# Patient Record
Sex: Male | Born: 1975 | Hispanic: No | Marital: Single | Smoking: Never smoker
Health system: Southern US, Community
[De-identification: ages and names within clinical notes are randomized; demographics above are authoritative.]

---

## 2002-05-25 ENCOUNTER — Emergency Department (HOSPITAL_COMMUNITY): Admission: EM | Admit: 2002-05-25 | Discharge: 2002-05-25 | Payer: Self-pay | Admitting: Emergency Medicine

## 2002-05-25 ENCOUNTER — Encounter: Payer: Self-pay | Admitting: Emergency Medicine

## 2014-12-26 ENCOUNTER — Emergency Department (INDEPENDENT_AMBULATORY_CARE_PROVIDER_SITE_OTHER): Payer: Medicaid - Out of State

## 2014-12-26 ENCOUNTER — Encounter (HOSPITAL_COMMUNITY): Payer: Self-pay | Admitting: Emergency Medicine

## 2014-12-26 ENCOUNTER — Emergency Department (HOSPITAL_COMMUNITY): Payer: Medicaid - Out of State

## 2014-12-26 ENCOUNTER — Emergency Department (INDEPENDENT_AMBULATORY_CARE_PROVIDER_SITE_OTHER)
Admission: EM | Admit: 2014-12-26 | Discharge: 2014-12-26 | Disposition: A | Discharge status: Home / Self Care | Payer: Medicaid - Out of State | Source: Home / Self Care | Attending: Family Medicine | Admitting: Family Medicine

## 2014-12-26 DIAGNOSIS — S60222A Contusion of left hand, initial encounter: Secondary | ICD-10-CM

## 2014-12-26 DIAGNOSIS — S6000XA Contusion of unspecified finger without damage to nail, initial encounter: Secondary | ICD-10-CM | POA: Diagnosis not present

## 2014-12-26 DIAGNOSIS — S60221A Contusion of right hand, initial encounter: Secondary | ICD-10-CM

## 2014-12-26 DIAGNOSIS — S62308A Unspecified fracture of other metacarpal bone, initial encounter for closed fracture: Secondary | ICD-10-CM | POA: Diagnosis not present
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
## 2014-12-26 NOTE — Discharge Instructions (Signed)
Cast or Splint Care °Casts and splints support injured limbs and keep bones from moving while they heal. It is important to care for your cast or splint at home.   °HOME CARE INSTRUCTIONS °· Keep the cast or splint uncovered during the drying period. It can take 24 to 48 hours to dry if it is made of plaster. A fiberglass cast will dry in less than 1 hour. °· Do not rest the cast on anything harder than a pillow for the first 24 hours. °· Do not put weight on your injured limb or apply pressure to the cast until your health care provider gives you permission. °· Keep the cast or splint dry. Wet casts or splints can lose their shape and may not support the limb as well. A wet cast that has lost its shape can also create harmful pressure on your skin when it dries. Also, wet skin can become infected. °· Cover the cast or splint with a plastic bag when bathing or when out in the rain or snow. If the cast is on the trunk of the body, take sponge baths until the cast is removed. °· If your cast does become wet, dry it with a towel or a blow dryer on the cool setting only. °· Keep your cast or splint clean. Soiled casts may be wiped with a moistened cloth. °· Do not place any hard or soft foreign objects under your cast or splint, such as cotton, toilet paper, lotion, or powder. °· Do not try to scratch the skin under the cast with any object. The object could get stuck inside the cast. Also, scratching could lead to an infection. If itching is a problem, use a blow dryer on a cool setting to relieve discomfort. °· Do not trim or cut your cast or remove padding from inside of it. °· Exercise all joints next to the injury that are not immobilized by the cast or splint. For example, if you have a long leg cast, exercise the hip joint and toes. If you have an arm cast or splint, exercise the shoulder, elbow, thumb, and fingers. °· Elevate your injured arm or leg on 1 or 2 pillows for the first 1 to 3 days to decrease  swelling and pain. It is best if you can comfortably elevate your cast so it is higher than your heart. °SEEK MEDICAL CARE IF:  °· Your cast or splint cracks. °· Your cast or splint is too tight or too loose. °· You have unbearable itching inside the cast. °· Your cast becomes wet or develops a soft spot or area. °· You have a bad smell coming from inside your cast. °· You get an object stuck under your cast. °· Your skin around the cast becomes red or raw. °· You have new pain or worsening pain after the cast has been applied. °SEEK IMMEDIATE MEDICAL CARE IF:  °· You have fluid leaking through the cast. °· You are unable to move your fingers or toes. °· You have discolored (blue or white), cool, painful, or very swollen fingers or toes beyond the cast. °· You have tingling or numbness around the injured area. °· You have severe pain or pressure under the cast. °· You have any difficulty with your breathing or have shortness of breath. °· You have chest pain. °Document Released: 05/20/2000 Document Revised: 03/13/2013 Document Reviewed: 11/29/2012 °Exit[employer]® Patient Information ©2015 [employer]. This information is not intended to replace advice given to you by your health care   provider. Make sure you discuss any questions you have with your health care provider.  Hand Fracture, Metacarpals Fractures of metacarpals are breaks in the bones of the hand. They extend from the knuckles to the wrist. These bones can undergo many types of fractures. There are different ways of treating these fractures, all of which may be correct. TREATMENT  Hand fractures can be treated with:   Non-reduction - The fracture is casted without changing the positions of the fracture (bone pieces) involved. This fracture is usually left in a cast for 4 to 6 weeks or as your caregiver thinks necessary.  Closed reduction - The bones are moved back into position without surgery and then casted.  ORIF (open reduction and internal  fixation) - The fracture site is opened and the bone pieces are fixed into place with some type of hardware, such as screws, etc. They are then casted. Your caregiver will discuss the type of fracture you have and the treatment that should be best for that problem. If surgery is chosen, let your caregivers know about the following.  LET YOUR CAREGIVERS KNOW ABOUT:  Allergies.  Medications you are taking, including herbs, eye drops, over the counter medications, and creams.  Use of steroids (by mouth or creams).  Previous problems with anesthetics or novocaine.  Possibility of pregnancy.  History of blood clots (thrombophlebitis).  History of bleeding or blood problems.  Previous surgeries.  Other health problems. AFTER THE PROCEDURE After surgery, you will be taken to the recovery area where a nurse will watch and check your progress. Once you are awake, stable, and taking fluids well, barring other problems, you'll be allowed to go home. Once home, an ice pack applied to your operative site may help with pain and keep the swelling down. HOME CARE INSTRUCTIONS   Follow your caregiver's instructions as to activities, exercises, physical therapy, and driving a car.  Daily exercise is helpful for keeping range of motion and strength. Exercise as instructed.  To lessen swelling, keep the injured hand elevated above the level of your heart as much as possible.  Apply ice to the injury for 15-20 minutes each hour while awake for the first 2 days. Put the ice in a plastic bag and place a thin towel between the bag of ice and your cast.  Move the fingers of your casted hand several times a day.  If a plaster or fiberglass cast was applied:  Do not try to scratch the skin under the cast using a sharp or pointed object.  Check the skin around the cast every day. You may put lotion on red or sore areas.  Keep your cast dry. Your cast can be protected during bathing with a plastic bag.  Do not put your cast into the water.  If a plaster splint was applied:  Wear your splint for as long as directed by your caregiver or until seen again.  Do not get your splint wet. Protect it during bathing with a plastic bag.  You may loosen the elastic bandage around the splint if your fingers start to get numb, tingle, get cold or turn blue.  Do not put pressure on your cast or splint; this may cause it to break. Especially, do not lean plaster casts on hard surfaces for 24 hours after application.  Take medications as directed by your caregiver.  Only take over-the-counter or prescription medicines for pain, discomfort, or fever as directed by your caregiver.  Follow-up as provided by  your caregiver. This is very important in order to avoid permanent injury or disability and chronic pain. SEEK MEDICAL CARE IF:   Increased bleeding (more than a small spot) from beneath your cast or splint if there is beneath the cast as with an open reduction.  Redness, swelling, or increasing pain in the wound or from beneath your cast or splint.  Pus coming from wound or from beneath your cast or splint.  An unexplained oral temperature above 102 F (38.9 C) develops, or as your caregiver suggests.  A foul smell coming from the wound or dressing or from beneath your cast or splint.  You have a problem moving any of your fingers. SEEK IMMEDIATE MEDICAL CARE IF:   You develop a rash  You have difficulty breathing  You have any allergy problems If you do not have a window in your cast for observing the wound, a discharge or minor bleeding may show up as a stain on the outside of your cast. Report these findings to your caregiver. MAKE SURE YOU:   Understand these instructions.  Will watch your condition.  Will get help right away if you are not doing well or get worse. Document Released: 05/23/2005 Document Revised: 08/15/2011 Document Reviewed: 01/10/2008 [facility] Patient  Information 2015 [place], [place]. This information is not intended to replace advice given to you by your health care provider. Make sure you discuss any questions you have with your health care provider.
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
## 2017-02-17 IMAGING — DX DG HAND COMPLETE 3+V*R*
3 series · 3 of 3 positions shown · non-contrast
Comparison: None.

CLINICAL DATA: Right hand pain, trauma

EXAM:
RIGHT HAND - COMPLETE 3+ VIEW

[hand pa]
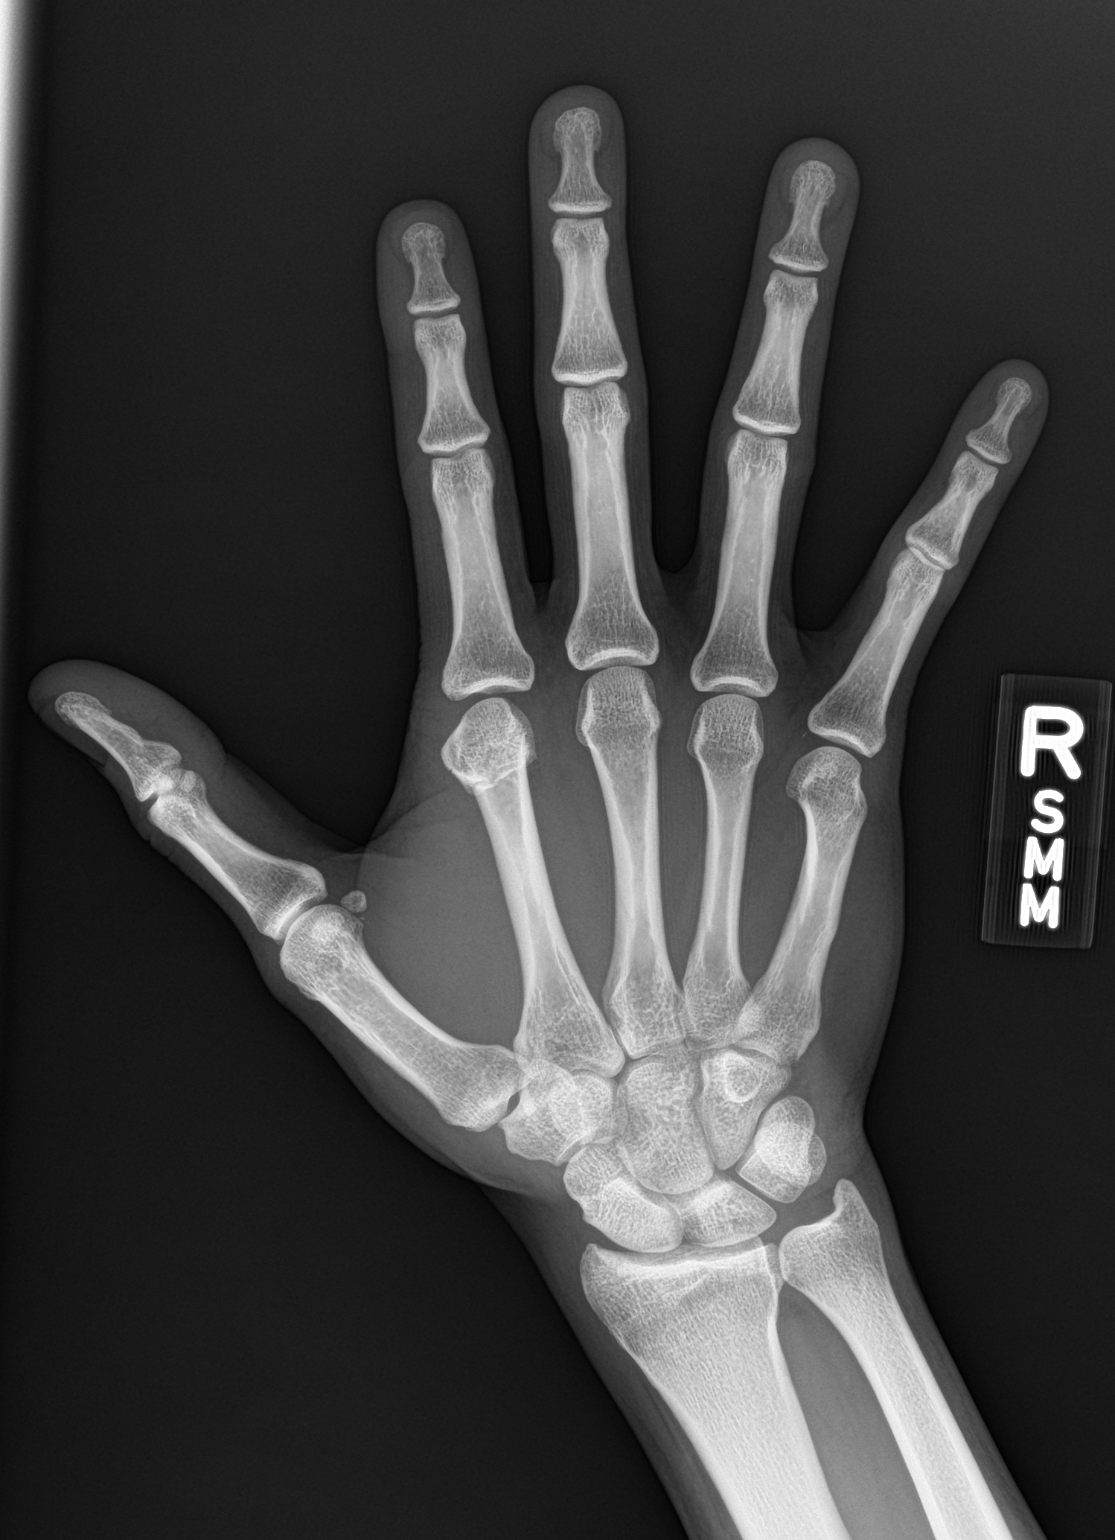

[hand obl]
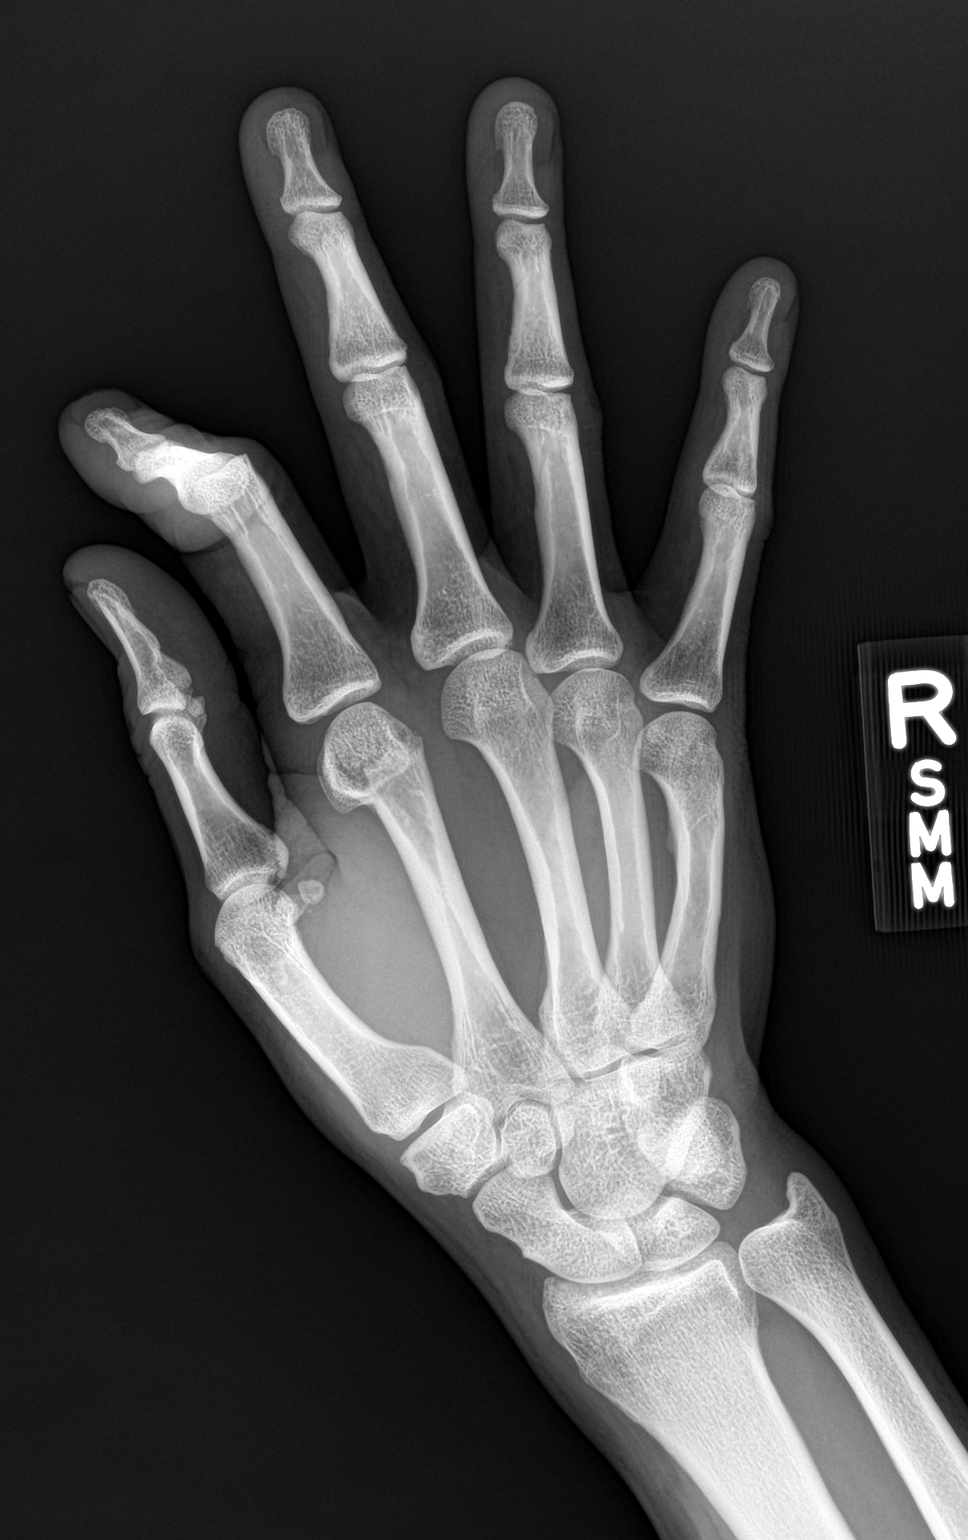

[hand lat]
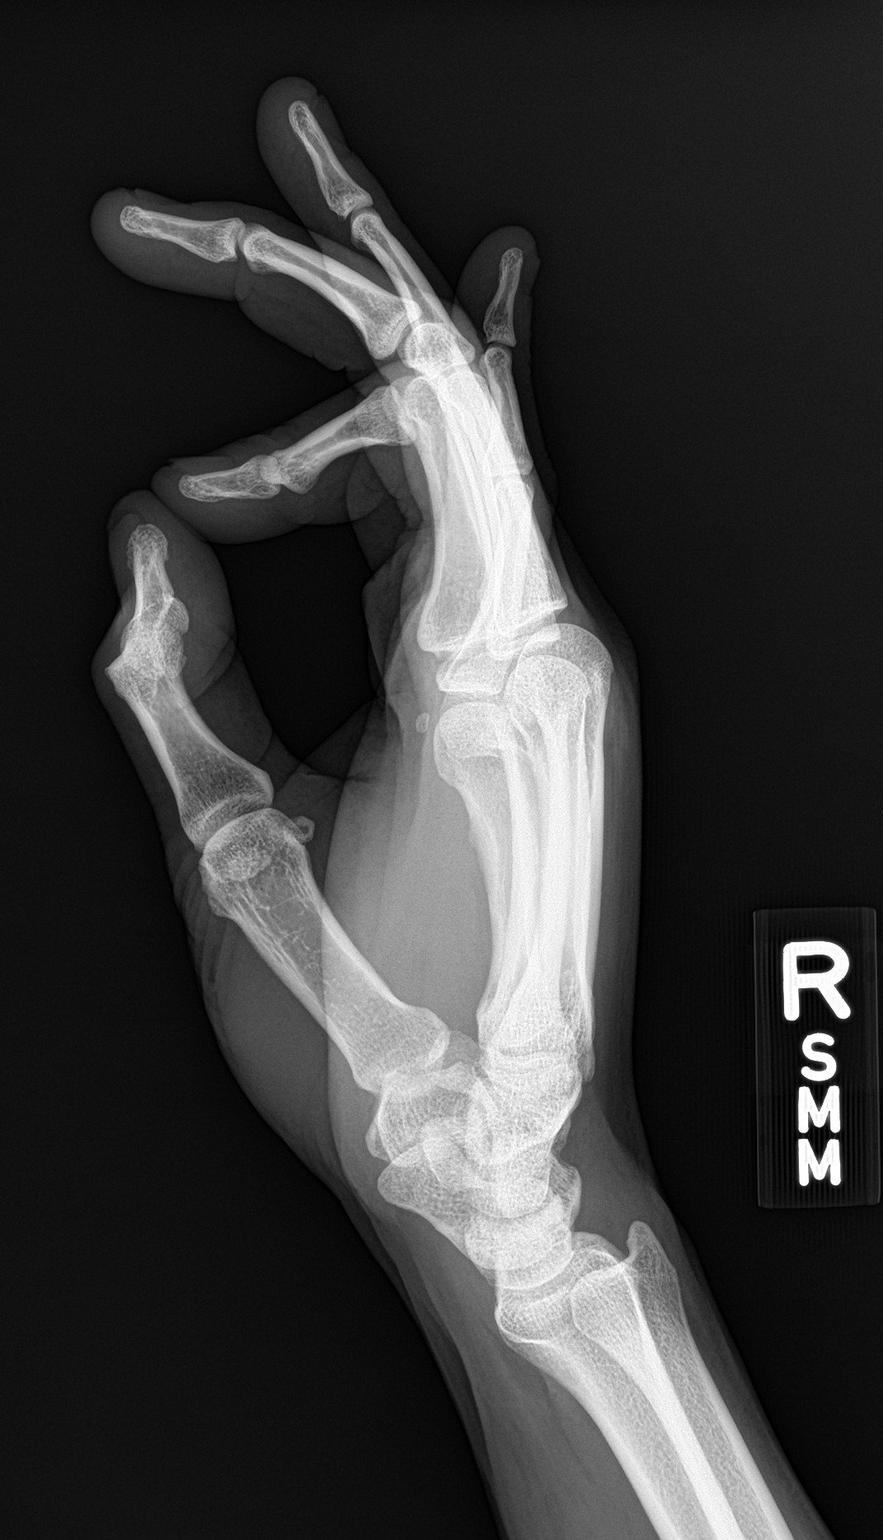

[3 of 3 positions shown; findings below may reference images not displayed]

FINDINGS: There is a fracture at the head of the right second metacarpal with
mild apex dorsal angulation of the fracture fragments. This is not
well seen on the lateral projection due to overlap. No radiopaque
foreign body. No dislocation.
IMPRESSION: Fracture at the head of the right second metacarpal.

## 2022-12-23 DIAGNOSIS — R1084 Generalized abdominal pain: Secondary | ICD-10-CM | POA: Diagnosis present

## 2022-12-23 DIAGNOSIS — R112 Nausea with vomiting, unspecified: Secondary | ICD-10-CM | POA: Insufficient documentation

## 2022-12-23 DIAGNOSIS — I10 Essential (primary) hypertension: Secondary | ICD-10-CM | POA: Insufficient documentation

## 2022-12-24 ENCOUNTER — Other Ambulatory Visit: Payer: Self-pay

## 2022-12-24 ENCOUNTER — Emergency Department (HOSPITAL_COMMUNITY): Payer: Medicaid - Out of State

## 2022-12-24 ENCOUNTER — Encounter (HOSPITAL_COMMUNITY): Payer: Self-pay | Admitting: *Deleted

## 2022-12-24 ENCOUNTER — Emergency Department (HOSPITAL_COMMUNITY)
Admission: EM | Admit: 2022-12-24 | Discharge: 2022-12-24 | Disposition: A | Discharge status: Home / Self Care | Payer: Medicaid - Out of State | Attending: Emergency Medicine | Admitting: Emergency Medicine

## 2022-12-24 DIAGNOSIS — R112 Nausea with vomiting, unspecified: Secondary | ICD-10-CM

## 2022-12-24 DIAGNOSIS — R1084 Generalized abdominal pain: Secondary | ICD-10-CM

## 2022-12-24 LAB — URINALYSIS, ROUTINE W REFLEX MICROSCOPIC
Bacteria, UA: NONE SEEN
Bilirubin Urine: NEGATIVE
Glucose, UA: NEGATIVE mg/dL
Hgb urine dipstick: NEGATIVE
Ketones, ur: NEGATIVE mg/dL
Nitrite: NEGATIVE
Protein, ur: 30 mg/dL — AB
Specific Gravity, Urine: 1.029 (ref 1.005–1.030)
pH: 5 (ref 5.0–8.0)

## 2022-12-24 LAB — COMPREHENSIVE METABOLIC PANEL
ALT: 20 U/L (ref 0–44)
AST: 14 U/L — ABNORMAL LOW (ref 15–41)
Albumin: 4.3 g/dL (ref 3.5–5.0)
Alkaline Phosphatase: 61 U/L (ref 38–126)
Anion gap: 12 (ref 5–15)
BUN: 19 mg/dL (ref 6–20)
CO2: 24 mmol/L (ref 22–32)
Calcium: 9.3 mg/dL (ref 8.9–10.3)
Chloride: 105 mmol/L (ref 98–111)
Creatinine, Ser: 0.98 mg/dL (ref 0.61–1.24)
GFR, Estimated: >60 mL/min (ref >60)
Glucose, Bld: 141 mg/dL — ABNORMAL HIGH (ref 70–99)
Potassium: 3.6 mmol/L (ref 3.5–5.1)
Sodium: 141 mmol/L (ref 135–145)
Total Bilirubin: 0.3 mg/dL (ref 0.3–1.2)
Total Protein: 7 g/dL (ref 6.5–8.1)

## 2022-12-24 LAB — CBC
HCT: 37.4 % — ABNORMAL LOW (ref 39.0–52.0)
Hemoglobin: 12.6 g/dL — ABNORMAL LOW (ref 13.0–17.0)
MCH: 29.9 pg (ref 26.0–34.0)
MCHC: 33.7 g/dL (ref 30.0–36.0)
MCV: 88.8 fL (ref 80.0–100.0)
Platelets: 233 10*3/uL (ref 150–400)
RBC: 4.21 MIL/uL — ABNORMAL LOW (ref 4.22–5.81)
RDW: 13 % (ref 11.5–15.5)
WBC: 8.9 10*3/uL (ref 4.0–10.5)
nRBC: 0 % (ref 0.0–0.2)

## 2022-12-24 LAB — LIPASE, BLOOD: Lipase: 31 U/L (ref 11–51)

## 2022-12-24 MED ORDER — ONDANSETRON 4 MG PO TBDP
8.0000 mg | ORAL_TABLET | Freq: Once | ORAL | Status: AC
  Administered 2022-12-24: 8 mg via ORAL
  Filled 2022-12-24: qty 2

## 2022-12-24 MED ORDER — SODIUM CHLORIDE 0.9 % IV BOLUS
1000.0000 mL | Freq: Once | INTRAVENOUS | Status: AC
  Administered 2022-12-24: 1000 mL via INTRAVENOUS

## 2022-12-24 MED ORDER — PANTOPRAZOLE SODIUM 40 MG IV SOLR
INTRAVENOUS | Status: AC
  Administered 2022-12-24: 40 mg via INTRAVENOUS
  Filled 2022-12-24: qty 10

## 2022-12-24 MED ORDER — FAMOTIDINE 20 MG PO TABS: 20.0000 mg | ORAL_TABLET | Freq: Two times a day (BID) | ORAL | 0 refills | Status: AC

## 2022-12-24 MED ORDER — PANTOPRAZOLE SODIUM 40 MG IV SOLR: 40.0000 mg | Freq: Once | INTRAVENOUS | Status: AC

## 2022-12-24 MED ORDER — PANTOPRAZOLE SODIUM 20 MG PO TBEC: 20.0000 mg | DELAYED_RELEASE_TABLET | Freq: Every day | ORAL | 0 refills | Status: AC

## 2022-12-24 MED ORDER — ONDANSETRON HCL 4 MG PO TABS: 4.0000 mg | ORAL_TABLET | Freq: Three times a day (TID) | ORAL | 0 refills | Status: AC | PRN

## 2022-12-24 MED ORDER — FAMOTIDINE IN NACL 20-0.9 MG/50ML-% IV SOLN
INTRAVENOUS | Status: AC
  Administered 2022-12-24: 20 mg via INTRAVENOUS
  Filled 2022-12-24: qty 50

## 2022-12-24 MED ORDER — FAMOTIDINE IN NACL 20-0.9 MG/50ML-% IV SOLN: 20.0000 mg | Freq: Once | INTRAVENOUS | Status: AC

## 2022-12-24 MED ORDER — IOHEXOL 350 MG/ML SOLN
75.0000 mL | Freq: Once | INTRAVENOUS | Status: AC | PRN
  Administered 2022-12-24: 75 mL via INTRAVENOUS

## 2022-12-24 NOTE — Discharge Instructions (Addendum)
You were evaluated today for abdominal pain, nausea, vomiting.  Your workup was reassuring for no acute findings in the abdomen or pelvis.  Your labs were grossly reassuring.  I have prescribed medication to be taken as directed.  The Zofran is for nausea/vomiting.  The other medications are for reflux symptom management.  Please establish care with a primary care provider for further evaluation and management as needed.  If you develop any life-threatening symptoms please return to the emergency department.

## 2022-12-24 NOTE — ED Provider Notes (Signed)
[place] EMERGENCY DEPARTMENT AT [facility] Provider Note   CSN: [id number] Arrival date & time: 12/23/22  2349     History  Chief Complaint  Patient presents with   Abdominal Pain    Juan Baker is a 47 y.o. male.  Patient with past medical history of hypertension , GERD, presents to the emergency department complaining of generalized abdominal pain, nausea, vomiting which began at approximately 11 PM.  He endorses multiple episodes of nonbloody nonbilious emesis since onset.  He denies chest pain, shortness of breath, fevers, headache, urinary symptoms, diarrhea.  He denies any new or different foods, denies alcohol consumption prior to onset.  HPI     Home Medications Prior to Admission medications   Medication Sig Start Date End Date Taking? Authorizing Provider  famotidine (PEPCID) 20 MG tablet Take 1 tablet (20 mg total) by mouth 2 (two) times daily. 12/24/22  Yes Barrie Dunker B, PA-C  ondansetron (ZOFRAN) 4 MG tablet Take 1 tablet (4 mg total) by mouth every 8 (eight) hours as needed for nausea or vomiting. 12/24/22  Yes Barrie Dunker B, PA-C  pantoprazole (PROTONIX) 20 MG tablet Take 1 tablet (20 mg total) by mouth daily. 12/24/22 01/23/23 Yes Darrick Grinder, PA-C  ibuprofen (ADVIL,MOTRIN) 200 MG tablet Take 200 mg by mouth every 6 (six) hours as needed.    [provider]      Allergies    Patient has no known allergies.    Review of Systems   Review of Systems  Physical Exam Updated Vital Signs BP (!) 119/92   Pulse 71   Temp (!) 97.3 F (36.3 C) (Oral)   Resp 18   Ht 5\' 11"  (1.803 m)   Wt 79.8 kg   SpO2 100%   BMI 24.55 kg/m  Physical Exam Vitals and nursing note reviewed.  Constitutional:      General: He is not in acute distress.    Appearance: He is well-developed.  HENT:     Head: Normocephalic and atraumatic.  Eyes:     Conjunctiva/sclera: Conjunctivae normal.  Cardiovascular:     Rate and Rhythm: Normal rate and  regular rhythm.     Heart sounds: No murmur heard. Pulmonary:     Effort: Pulmonary effort is normal. No respiratory distress.     Breath sounds: Normal breath sounds.  Abdominal:     Palpations: Abdomen is soft.     Tenderness: There is generalized abdominal tenderness.  Musculoskeletal:        General: No swelling.     Cervical back: Neck supple.  Skin:    General: Skin is warm and dry.     Capillary Refill: Capillary refill takes less than 2 seconds.  Neurological:     Mental Status: He is alert.  Psychiatric:        Mood and Affect: Mood normal.     ED Results / Procedures / Treatments   Labs (all labs ordered are listed, but only abnormal results are displayed) Labs Reviewed  COMPREHENSIVE METABOLIC PANEL - Abnormal; Notable for the following components:      Result Value   Glucose, Bld 141 (*)    AST 14 (*)    All other components within normal limits  CBC - Abnormal; Notable for the following components:   RBC 4.21 (*)    Hemoglobin 12.6 (*)    HCT 37.4 (*)    All other components within normal limits  URINALYSIS, ROUTINE W REFLEX MICROSCOPIC -  Abnormal; Notable for the following components:   APPearance HAZY (*)    Protein, ur 30 (*)    Leukocytes,Ua SMALL (*)    All other components within normal limits  LIPASE, BLOOD    EKG None  Radiology CT ABDOMEN PELVIS W CONTRAST  Result Date: 12/24/2022 CLINICAL DATA:  Abdominal pain, acute, nonlocalized EXAM: CT ABDOMEN AND PELVIS WITH CONTRAST TECHNIQUE: Multidetector CT imaging of the abdomen and pelvis was performed using the standard protocol following bolus administration of intravenous contrast. RADIATION DOSE REDUCTION: This exam was performed according to the departmental dose-optimization program which includes automated exposure control, adjustment of the mA and/or kV according to patient size and/or use of iterative reconstruction technique. CONTRAST:  75mL OMNIPAQUE IOHEXOL 350 MG/ML SOLN COMPARISON:  None  Available. FINDINGS: Lower chest: No acute abnormality Hepatobiliary: No focal hepatic abnormality. Gallbladder unremarkable. Pancreas: No focal abnormality or ductal dilatation. Spleen: No focal abnormality.  Normal size. Adrenals/Urinary Tract: Adrenal glands normal. No stones or hydronephrosis. 2 cm cyst in the midpole of the right kidney appears benign. No follow-up imaging recommended. Urinary bladder unremarkable. Stomach/Bowel: Stomach, large and small bowel grossly unremarkable. Appendix not visualized. Vascular/Lymphatic: No evidence of aneurysm or adenopathy. Reproductive: No visible focal abnormality. Other: No free fluid or free air. Musculoskeletal: No acute bony abnormality. IMPRESSION: No acute findings in the abdomen or pelvis. Electronically Signed   By: Charlett Nose M.D.   On: 12/24/2022 03:47    Procedures Procedures    Medications Ordered in ED Medications  ondansetron (ZOFRAN-ODT) disintegrating tablet 8 mg (8 mg Oral Given 12/24/22 0027)  famotidine (PEPCID) IVPB 20 mg premix (20 mg Intravenous New Bag/Given 12/24/22 0324)  pantoprazole (PROTONIX) injection 40 mg (40 mg Intravenous Given 12/24/22 0323)  sodium chloride 0.9 % bolus 1,000 mL (1,000 mLs Intravenous New Bag/Given 12/24/22 0314)  iohexol (OMNIPAQUE) 350 MG/ML injection 75 mL (75 mLs Intravenous Contrast Given 12/24/22 4098)    ED Course/ Medical Decision Making/ A&P                             Medical Decision Making Amount and/or Complexity of Data Reviewed Labs: ordered. Radiology: ordered.  Risk Prescription drug management.   This patient presents to the ED for concern of abdominal pain, this involves an extensive number of treatment options, and is a complaint that carries with it a high risk of complications and morbidity.  The differential diagnosis includes gastritis, appendicitis, cholecystitis, gastroenteritis, colitis, others   Co morbidities that complicate the patient  evaluation  Hypertension, GERD   Additional history obtained:  Additional history obtained from friend at bedside  Lab Tests:  I Ordered, and personally interpreted labs.  The pertinent results include: CMP, CBC, lipase, UA grossly unremarkable   Imaging Studies ordered:  I ordered imaging studies including CT abdomen pelvis with contrast I independently visualized and interpreted imaging which showed no acute findings I agree with the radiologist interpretation   Problem List / ED Course / Critical interventions / Medication management   I ordered medication including Zofran for nausea, Pepcid, Protonix, GI cocktail for gastritis like symptoms Reevaluation of the patient after these medicines showed that the patient improved I have reviewed the patients home medicines and have made adjustments as needed   Social Determinants of Health:  Patient has out-of-[place] Medicaid for his primary insurance, no local PCP   Test / Admission - Considered:  Patient with symptoms consistent with gastritis.  No  signs of cholecystitis, pancreatitis, appendicitis on CT scan.  Labs grossly unremarkable.  Vitals have been stable.  Plan to discharge home with prescriptions for Zofran, PPI, H2 blocker.  I have recommended the patient follow-up with a primary care as soon as he is able to establish 1.  Return precautions provided.  No indication for admission.  Discharge home         Final Clinical Impression(s) / ED Diagnoses Final diagnoses:  Generalized abdominal pain  Nausea and vomiting, unspecified vomiting type    Rx / DC Orders ED Discharge Orders          Ordered    famotidine (PEPCID) 20 MG tablet  2 times daily        12/24/22 0445    pantoprazole (PROTONIX) 20 MG tablet  Daily        12/24/22 0445    ondansetron (ZOFRAN) 4 MG tablet  Every 8 hours PRN        12/24/22 0445              Darrick Grinder, PA-C 12/24/22 0445    Marily Memos, MD 12/24/22  [phone]

## 2022-12-24 NOTE — ED Triage Notes (Signed)
Abd pain and n v all day not feling well at all
